# Patient Record
Sex: Male | Born: 1975 | Race: White | Hispanic: No | Marital: Married | Smoking: Never smoker
Health system: Southern US, Community
[De-identification: ages and names within clinical notes are randomized; demographics above are authoritative.]

## PROBLEM LIST (undated history)

## (undated) HISTORY — PX: NASAL SEPTUM SURGERY: SHX37

---

## 2007-09-06 ENCOUNTER — Emergency Department (HOSPITAL_COMMUNITY): Admission: EM | Admit: 2007-09-06 | Discharge: 2007-09-06 | Payer: Self-pay | Admitting: Emergency Medicine

## 2009-07-27 IMAGING — CT CT HEAD W/O CM
1 series · 16 of 30 positions shown, 20 images · non-contrast
Comparison: No prior studies.

CLINICAL DATA: Seizure activity today.

CT HEAD WITHOUT CONTRAST
TECHNIQUE: Contiguous axial images were obtained from the base of
the skull through the vertex without contrast.

[Series 2: head routine 4.8 h37s · axial · 0.43mm/px · z∈[-110,+18]mm · 16 of 30 slices shown, 20 images]
[im 2/30  brain]
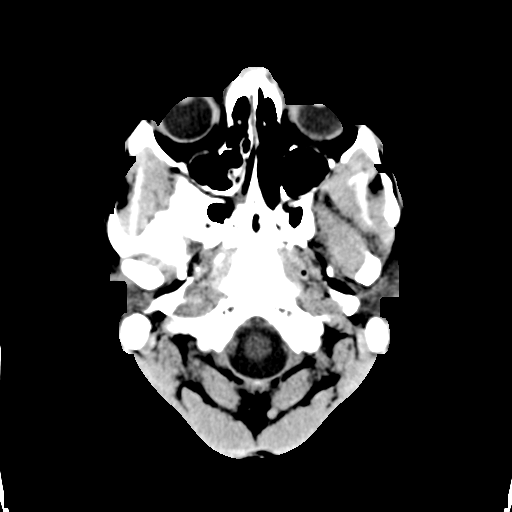
[im 2/30  bone]
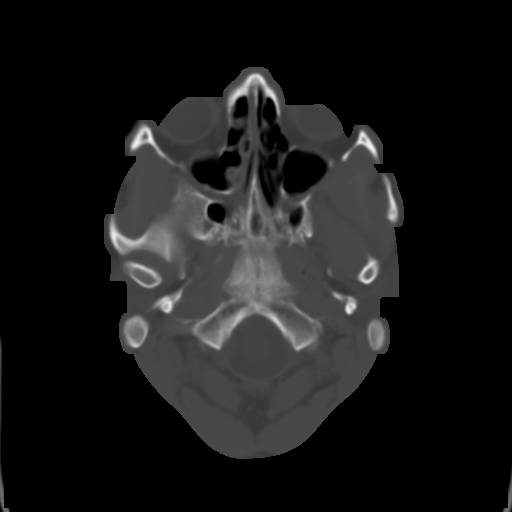
[im 4/30  brain]
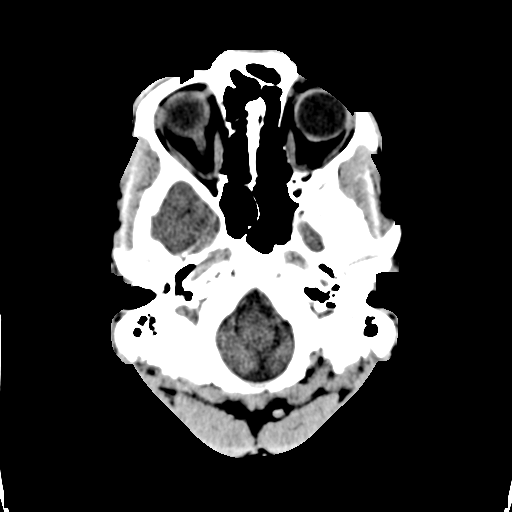
[im 6/30  brain]
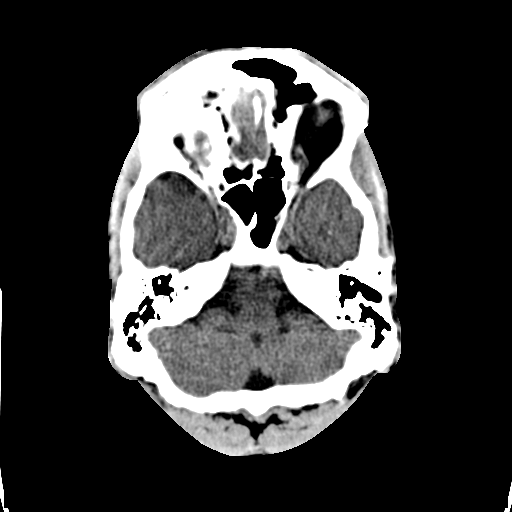
[im 8/30  brain]
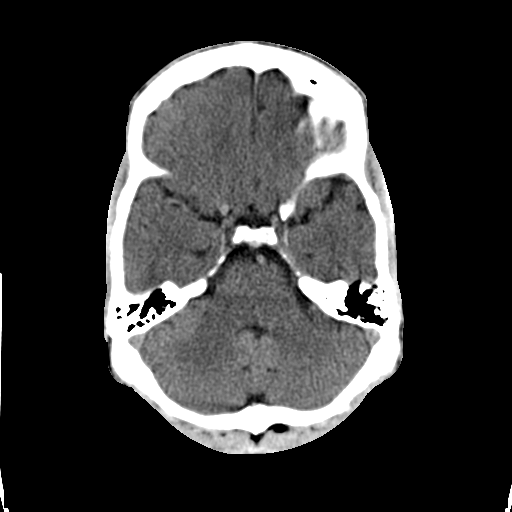
[im 9/30  brain]
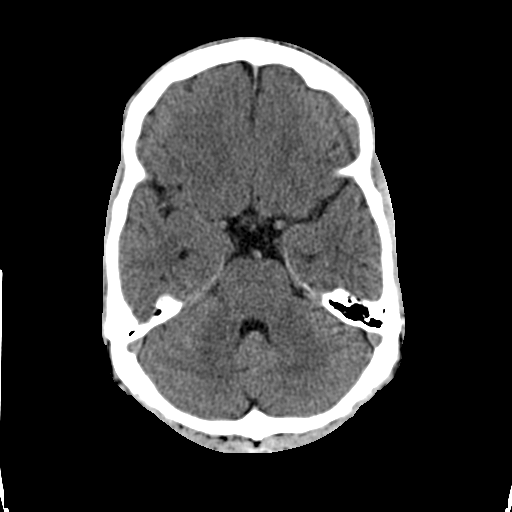
[im 9/30  bone]
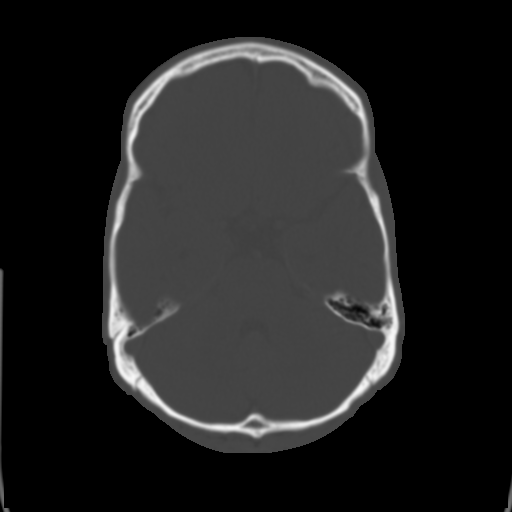
[im 11/30  brain]
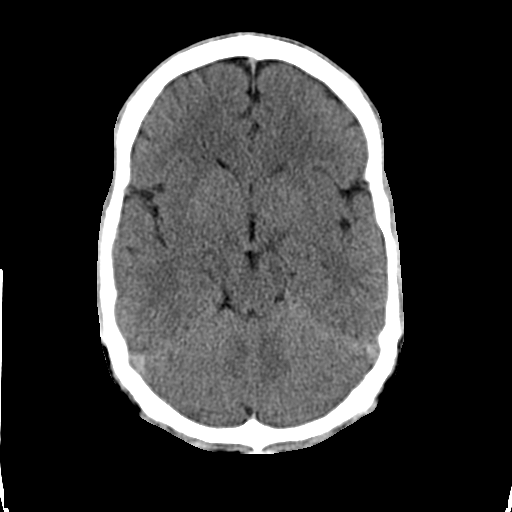
[im 13/30  brain]
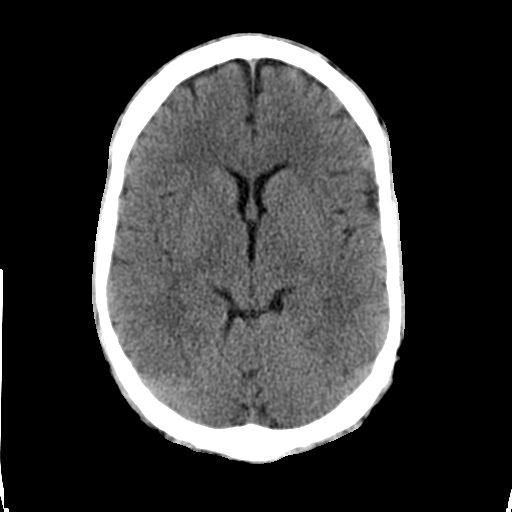
[im 15/30  brain]
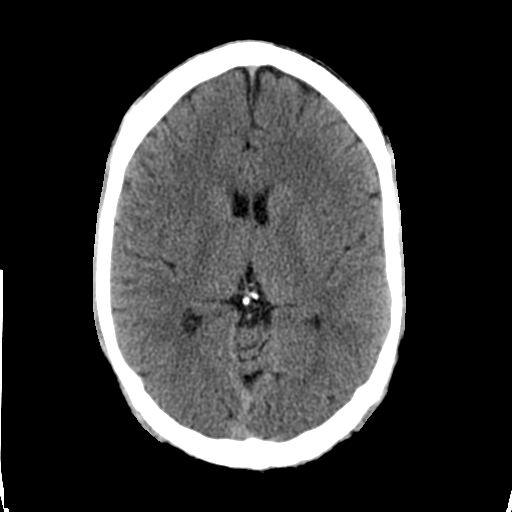
[im 16/30  brain]
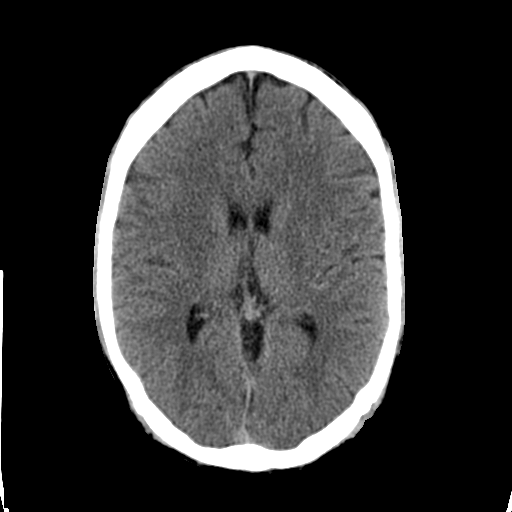
[im 16/30  bone]
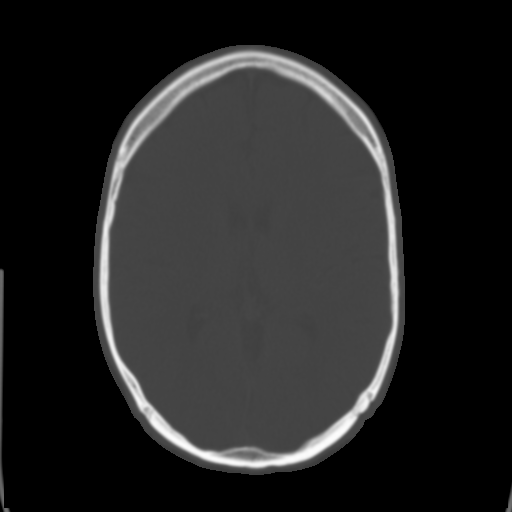
[im 18/30  brain]
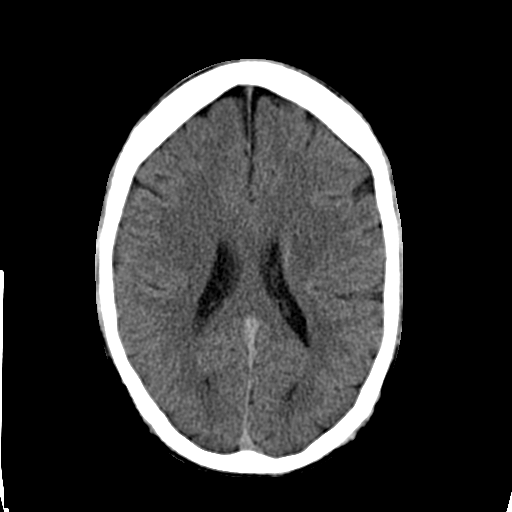
[im 20/30  brain]
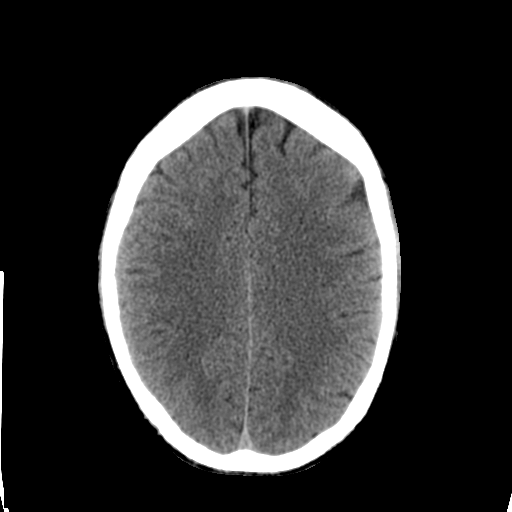
[im 22/30  brain]
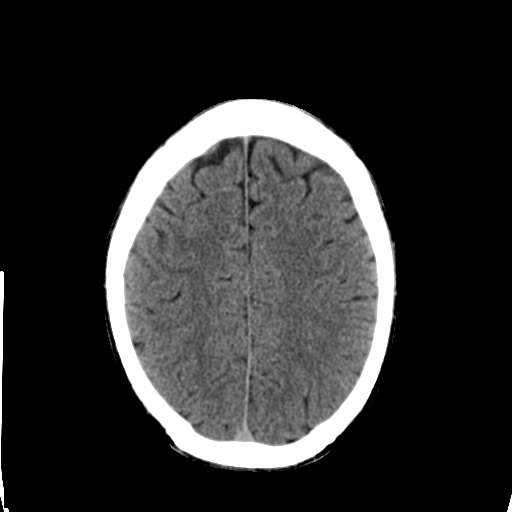
[im 23/30  brain]
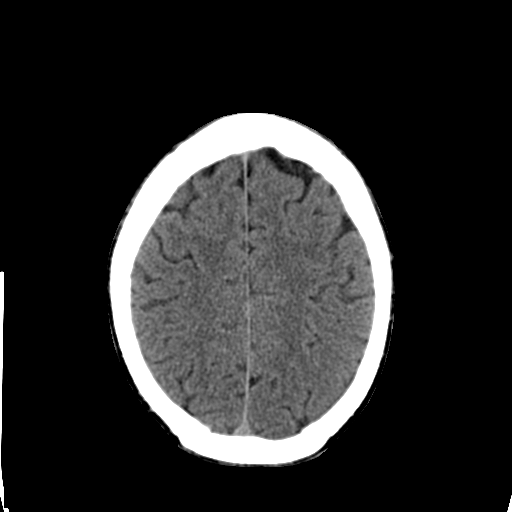
[im 23/30  bone]
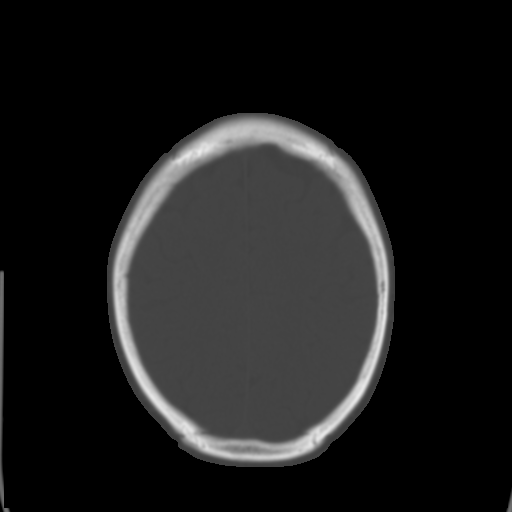
[im 25/30  brain]
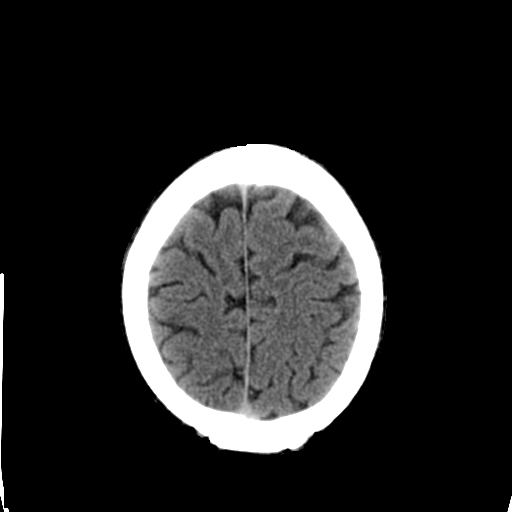
[im 27/30  brain]
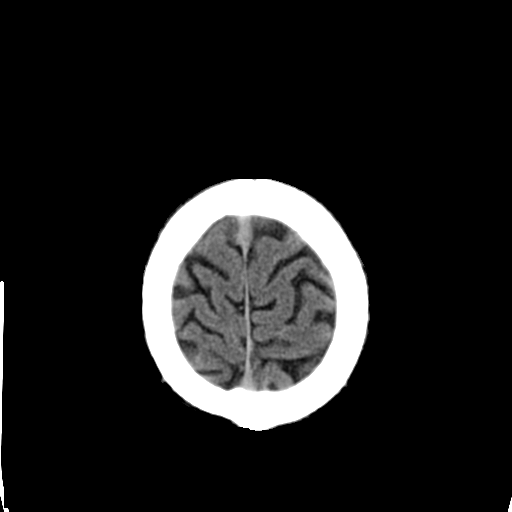
[im 29/30  brain]
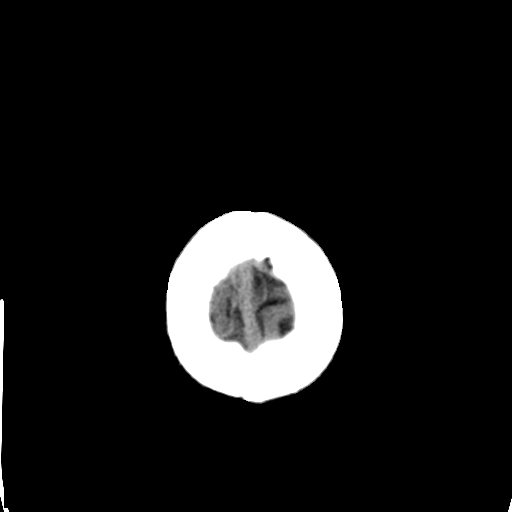

[16 of 30 positions shown; findings below may reference images not displayed]

FINDINGS: There are no midline shifts or mass effects.  The
ventricles are normal in size and contour.  No CT scan evidence for
recent stroke or hemorrhage and no extraaxial fluid collections.
IMPRESSION: Negative study..

## 2011-11-07 ENCOUNTER — Ambulatory Visit: Payer: BC Managed Care – PPO

## 2011-11-08 ENCOUNTER — Ambulatory Visit: Payer: BC Managed Care – PPO

## 2011-11-08 ENCOUNTER — Ambulatory Visit (INDEPENDENT_AMBULATORY_CARE_PROVIDER_SITE_OTHER): Payer: BC Managed Care – PPO | Admitting: Emergency Medicine

## 2011-11-08 VITALS — BP 104/66 | HR 68 | Temp 98.3°F | Resp 16 | Ht 64.0 in | Wt 161.0 lb

## 2011-11-08 DIAGNOSIS — M79609 Pain in unspecified limb: Secondary | ICD-10-CM

## 2011-11-08 DIAGNOSIS — S62309A Unspecified fracture of unspecified metacarpal bone, initial encounter for closed fracture: Secondary | ICD-10-CM

## 2011-11-08 DIAGNOSIS — M79643 Pain in unspecified hand: Secondary | ICD-10-CM

## 2011-11-08 NOTE — Progress Notes (Signed)
  Subjective:    Patient ID: Austin Schultz, male    DOB: [birth year], 36 y.o.   MRN: [id number]  HPI 36 year old male presents with left hand pain s/p a fall while playing football last night.  States he believes he fell landing on the posterior aspect of his hand and his 5th phalange bent backward.  Admits to some numbness but no tingling.  Has been taking Motrin for pain and says it has been well controlled.  Able to move all 5 fingers and wrist.  He is an otherwise healthy male with no known medical problems.     Review of Systems  Constitutional: Negative for fever and chills.  Musculoskeletal: Positive for joint swelling (and pain ).  Skin: Negative for wound.  Neurological: Negative for weakness.  All other systems reviewed and are negative.       Objective:   Physical Exam  Constitutional: He is oriented to person, place, and time. He appears well-developed and well-nourished.  HENT:  Head: Normocephalic and atraumatic.  Right Ear: External ear normal.  Left Ear: External ear normal.  Eyes: Conjunctivae normal are normal.  Neck: Normal range of motion.  Cardiovascular: Normal rate.   Pulmonary/Chest: Effort normal.  Musculoskeletal:       Left wrist: He exhibits normal range of motion, no tenderness and no bony tenderness.       Wrist normal. There is marked swelling over 3rd, 4th, and 5th metacarpals with pain to palpation between 4th and 5th metacarpals. Pain with ROM of 5th phalange. Ecymmosis of dorsum of hand. Normal cap refill and sensation  Neurological: He is alert and oriented to person, place, and time.  Psychiatric: He has a normal mood and affect. His behavior is normal. Judgment and thought content normal.      [facility] reading (PRIMARY) by  Dr. Cleta Alberts as spiral fracture or 5th metacarpal with shortening.      Assessment & Plan:   1. Fracture of metacarpal  Ambulatory referral to Orthopedic Surgery  2. Hand pain  DG Hand Complete Left  Placed in  ulnar-gutter splint and sling for comfort. Patient declines pain medication at this time, but ok to call in Vicodin if needed. He will take OTC ibuprofen or tylenol prn Plan to see Dr. Melvyn Novas or Amanda Pea this week - referral made.   Follow up as needed.

## 2012-03-30 ENCOUNTER — Ambulatory Visit (INDEPENDENT_AMBULATORY_CARE_PROVIDER_SITE_OTHER): Payer: 59 | Admitting: Family Medicine

## 2012-03-30 VITALS — BP 122/80 | HR 72 | Temp 97.8°F | Resp 16 | Ht 64.0 in | Wt 163.0 lb

## 2012-03-30 DIAGNOSIS — B356 Tinea cruris: Secondary | ICD-10-CM

## 2012-03-30 DIAGNOSIS — R1032 Left lower quadrant pain: Secondary | ICD-10-CM

## 2012-03-30 DIAGNOSIS — M3390 Dermatopolymyositis, unspecified, organ involvement unspecified: Secondary | ICD-10-CM

## 2012-03-30 LAB — POCT CBC
HCT, POC: 43.3 % — AB (ref 43.5–53.7)
Hemoglobin: 14 g/dL — AB (ref 14.1–18.1)
Lymph, poc: 2.3 (ref 0.6–3.4)
MCH, POC: 29.7 pg (ref 27–31.2)
MCV: 91.8 fL (ref 80–97)
MPV: 11.4 fL (ref 0–99.8)
POC MID %: 8.8 %M (ref 0–12)
RBC: 4.72 M/uL (ref 4.69–6.13)

## 2012-03-30 LAB — POCT URINALYSIS DIPSTICK
Bilirubin, UA: NEGATIVE
Glucose, UA: NEGATIVE
Ketones, UA: NEGATIVE
Leukocytes, UA: NEGATIVE

## 2012-03-30 LAB — POCT UA - MICROSCOPIC ONLY
Bacteria, U Microscopic: NEGATIVE
Mucus, UA: NEGATIVE
RBC, urine, microscopic: NEGATIVE
WBC, Ur, HPF, POC: NEGATIVE

## 2012-03-30 MED ORDER — TERBINAFINE HCL 250 MG PO TABS: 250.0000 mg | ORAL_TABLET | Freq: Every day | ORAL | Status: DC

## 2012-03-30 NOTE — Patient Instructions (Signed)
Continue to drink lots of fluids to flush her kidneys out well  Avoid heavy lifting or straining  If pains continue to persist we will consider getting a scan or referring you to a surgeon for their opinion as to whether you might be getting an early hernia.  Take the terbinafine one pill daily for 2 weeks for the jock itch

## 2012-03-30 NOTE — Progress Notes (Signed)
Subjective: Patient been having a jock itch he's tried OTC medications on it. It seems to be better controlled a little bit that comes back.  He also been having left lower quadrant abdominal pain for about the last 10 days or so. He is gradually gotten worse especially last couple of days. It's a pain down in the left groin is fairly continuous but comes and goes to some degree. He wonders whether he should be doing hernia. Knows of no lifting or straining that could have injured it. Has not had any surgeries on his abdomen.  He is getting married Saturday and going on honeymoon this weekend to [place]  Objective: No acute distress. Normal bowel sounds. Soft without mass or tenderness. Having normal bowel movements. Normal male external genitalia. Testes descended. No hernias no little tenderness in the left groin region. Mild rash increase her groin  Assessment: Tinea cruris Left lower quadrant abdominal pain  Plan: Check CBC and urinalysis Results for orders placed in visit on 03/30/12  POCT URINALYSIS DIPSTICK      Result Value Range   Color, UA light yellow     Clarity, UA clear     Glucose, UA neg     Bilirubin, UA neg     Ketones, UA neg     Spec Grav, UA <=1.005     Blood, UA neg     pH, UA 6.5     Protein, UA neg     Urobilinogen, UA 0.2     Nitrite, UA neg     Leukocytes, UA Negative    POCT UA - MICROSCOPIC ONLY      Result Value Range   WBC, Ur, HPF, POC neg     RBC, urine, microscopic neg     Bacteria, U Microscopic neg     Mucus, UA neg     Epithelial cells, urine per micros 0-1     Crystals, Ur, HPF, POC neg     Casts, Ur, LPF, POC neg     Yeast, UA neg    POCT CBC      Result Value Range   WBC 7.6  4.6 - 10.2 K/uL   Lymph, poc 2.3  0.6 - 3.4   POC LYMPH PERCENT 30.2  10 - 50 %L   MID (cbc) 0.7  0 - 0.9   POC MID % 8.8  0 - 12 %M   POC Granulocyte 4.6  2 - 6.9   Granulocyte percent 61.0  37 - 80 %G   RBC 4.72  4.69 - 6.13 M/uL   Hemoglobin 14.0 (*)  14.1 - 18.1 g/dL   HCT, POC 16.1 (*) 09.6 - 53.7 %   MCV 91.8  80 - 97 fL   MCH, POC 29.7  27 - 31.2 pg   MCHC 32.3  31.8 - 35.4 g/dL   RDW, POC 04.5     Platelet Count, POC 219  142 - 424 K/uL   MPV 11.4  0 - 99.8 fL   Assessment: Groin strain with pain, suspicious for developing an early hernia Tinea cruris  Plan: See orders for discharge instructions

## 2012-08-08 ENCOUNTER — Other Ambulatory Visit: Payer: Self-pay | Admitting: Family Medicine

## 2012-08-08 NOTE — Telephone Encounter (Signed)
PATIENT IS CALLING TO SAY IT IS URGENT THAT HE GETS HIS MEDS FOR [facility]

## 2013-09-28 IMAGING — CR DG HAND COMPLETE 3+V*L*
3 series · 3 of 3 positions shown · non-contrast
Comparison: None.

CLINICAL DATA: Recent traumatic injury with pain

LEFT HAND - COMPLETE 3+ VIEW

[PA]
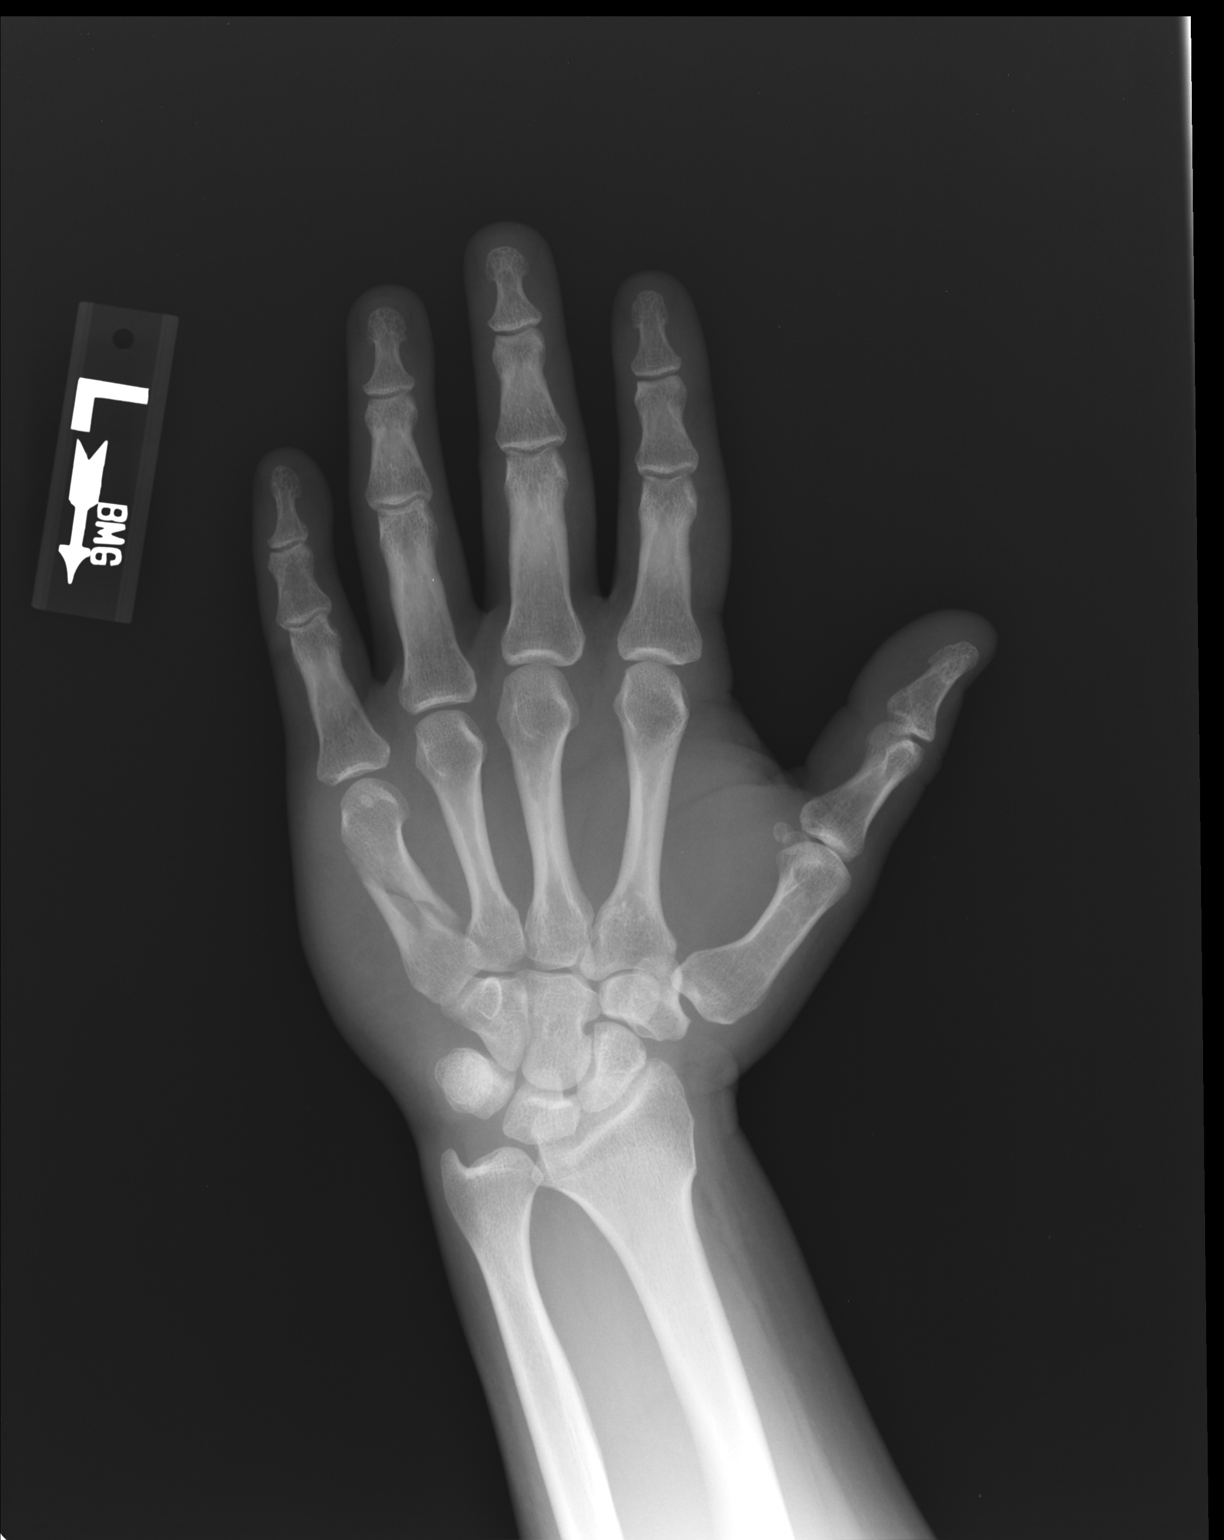

[pa obl]
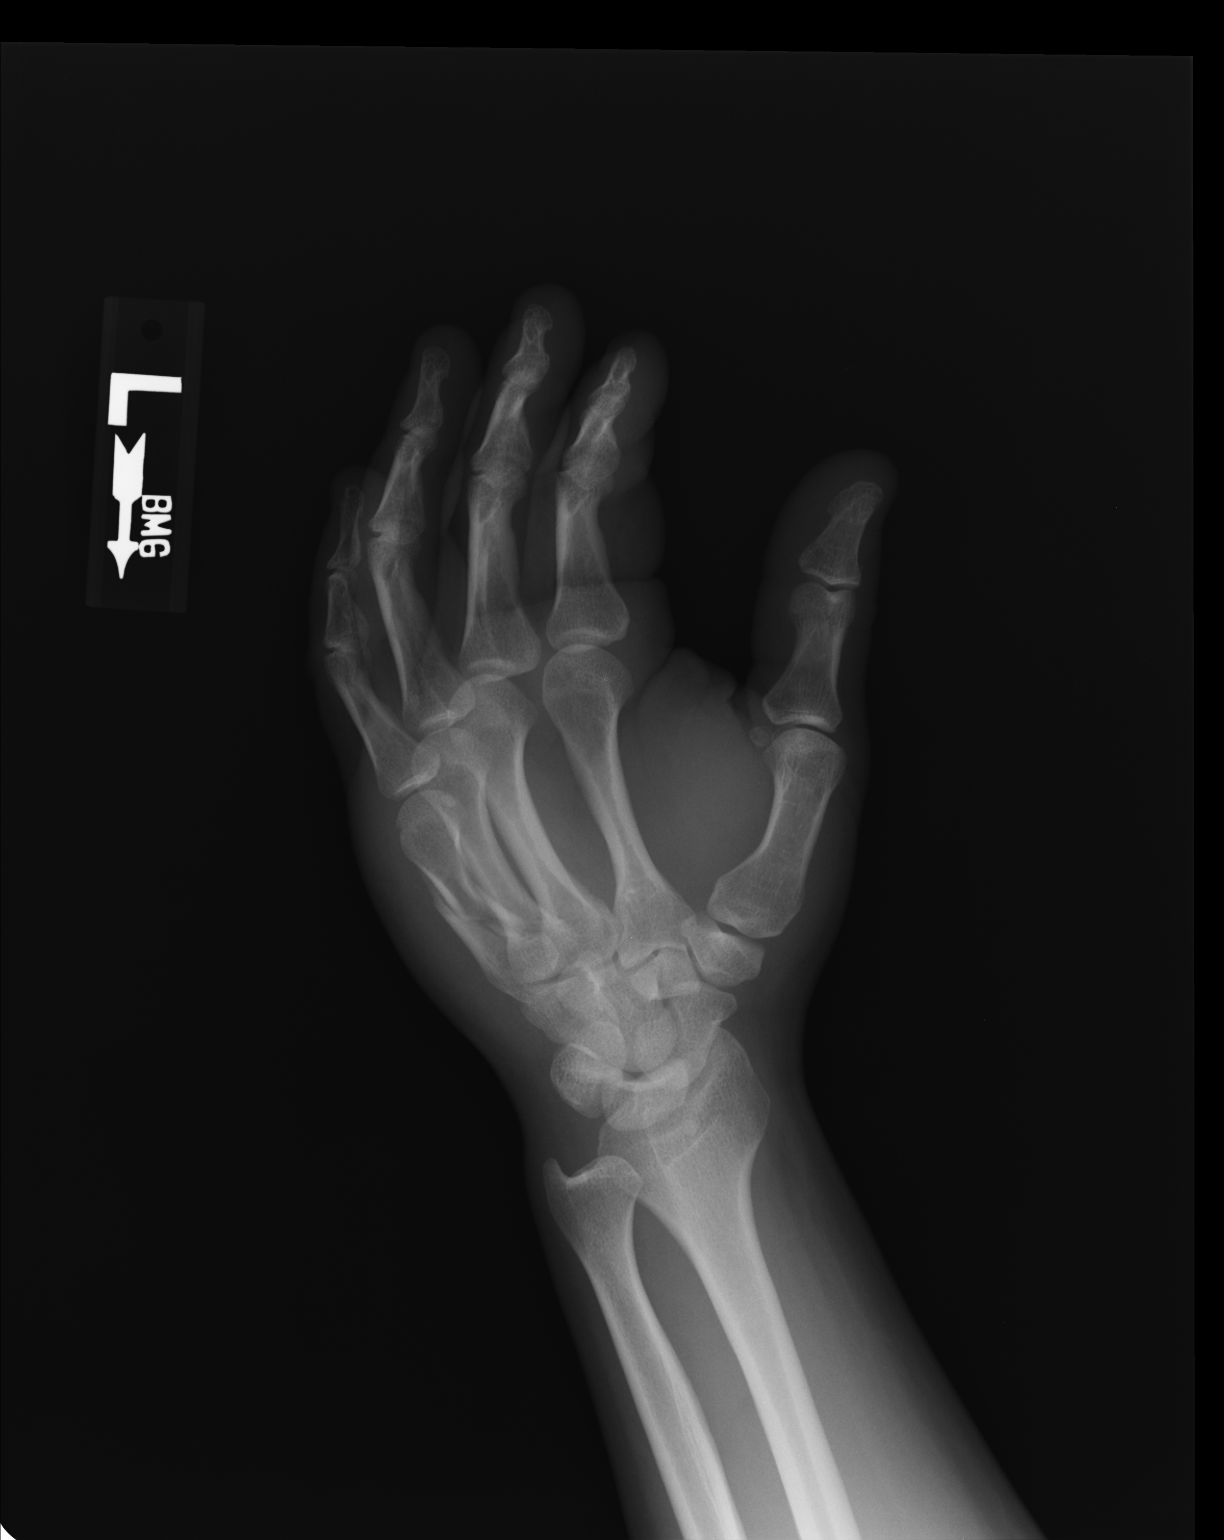

[lateral]
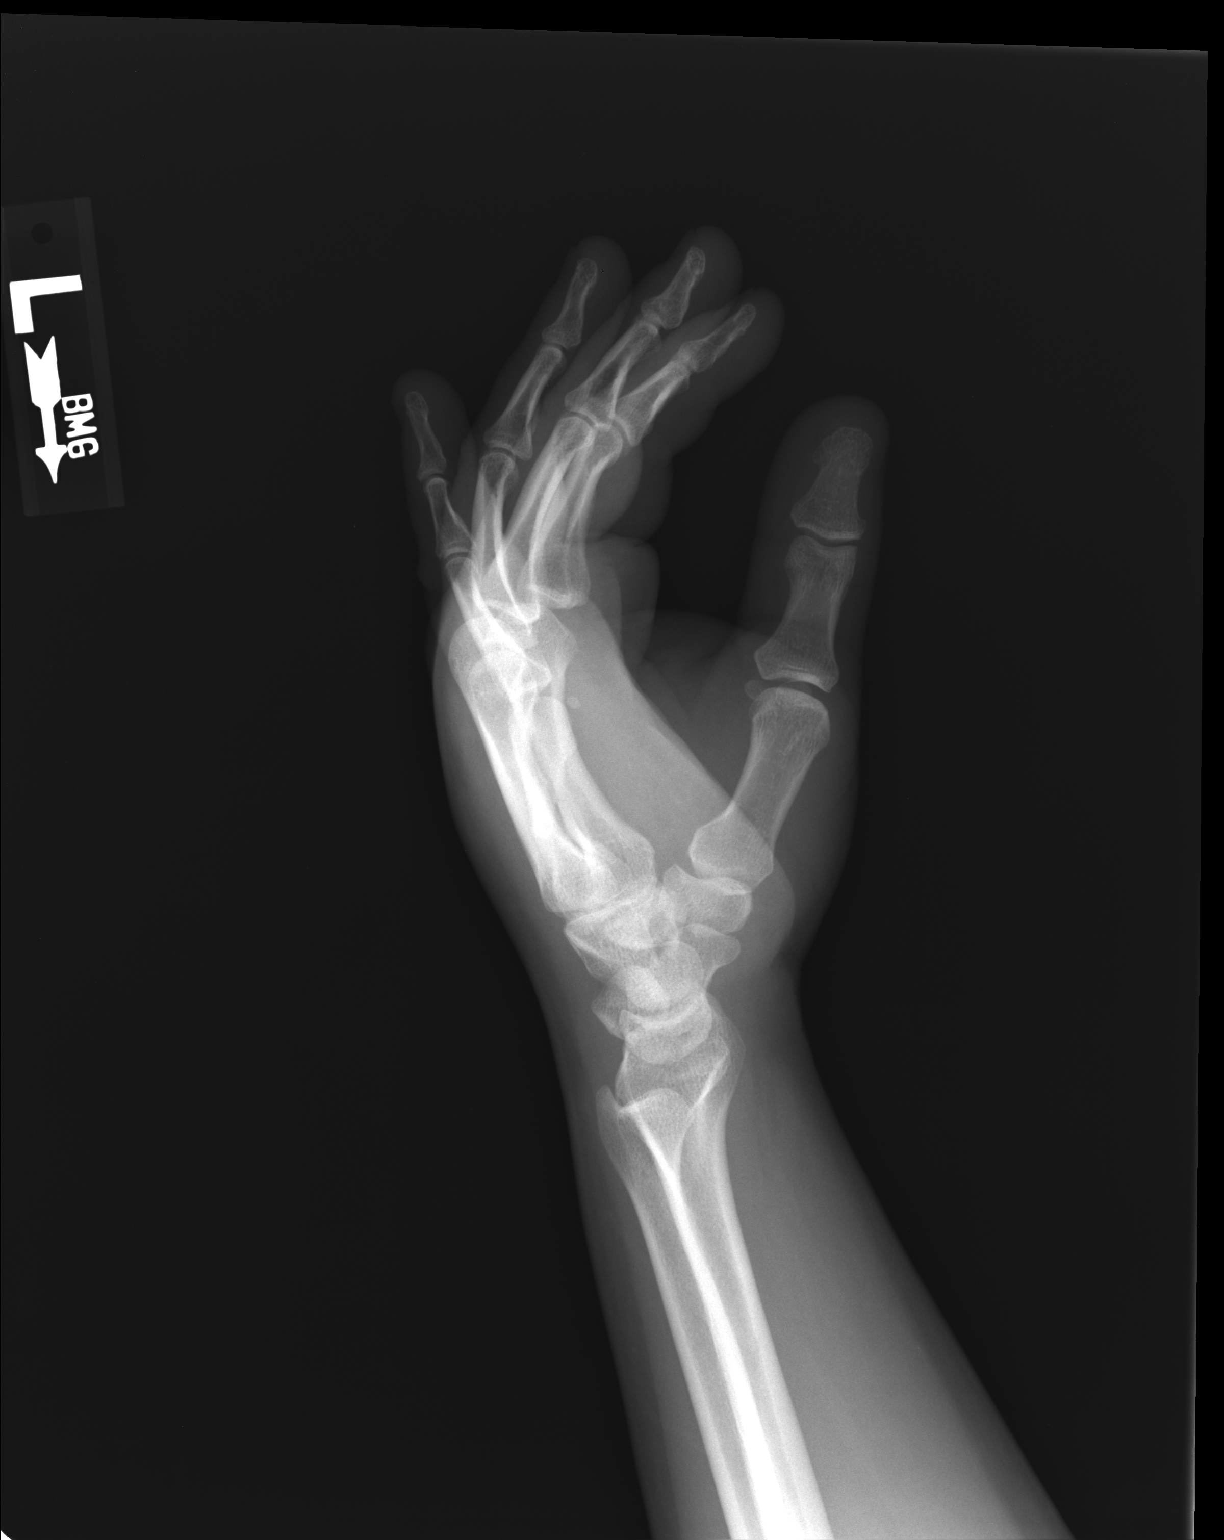

[3 of 3 positions shown; findings below may reference images not displayed]

FINDINGS: Three views of the left hand were obtained and reveal an
oblique fracture through the mid shaft of the fifth metacarpal with
some impaction at the fracture site.  No other fracture is noted.
Considerable soft tissue swelling is noted in the region fracture.
IMPRESSION: Oblique fracture through the mid region of the fifth metacarpal
with some impaction.

## 2019-09-27 ENCOUNTER — Encounter (HOSPITAL_BASED_OUTPATIENT_CLINIC_OR_DEPARTMENT_OTHER): Payer: Self-pay | Admitting: *Deleted

## 2019-09-27 ENCOUNTER — Emergency Department (HOSPITAL_BASED_OUTPATIENT_CLINIC_OR_DEPARTMENT_OTHER)
Admission: EM | Admit: 2019-09-27 | Discharge: 2019-09-27 | Disposition: A | Discharge status: Home / Self Care | Payer: No Typology Code available for payment source | Attending: Emergency Medicine | Admitting: Emergency Medicine

## 2019-09-27 ENCOUNTER — Other Ambulatory Visit: Payer: Self-pay

## 2019-09-27 DIAGNOSIS — S0083XA Contusion of other part of head, initial encounter: Secondary | ICD-10-CM | POA: Diagnosis not present

## 2019-09-27 DIAGNOSIS — Z5321 Procedure and treatment not carried out due to patient leaving prior to being seen by health care provider: Secondary | ICD-10-CM | POA: Insufficient documentation

## 2019-09-27 DIAGNOSIS — R1110 Vomiting, unspecified: Secondary | ICD-10-CM | POA: Diagnosis not present

## 2019-09-27 DIAGNOSIS — R5383 Other fatigue: Secondary | ICD-10-CM | POA: Diagnosis not present

## 2019-09-27 DIAGNOSIS — S0990XA Unspecified injury of head, initial encounter: Secondary | ICD-10-CM | POA: Diagnosis present

## 2019-09-27 DIAGNOSIS — R0781 Pleurodynia: Secondary | ICD-10-CM | POA: Diagnosis not present

## 2019-09-27 DIAGNOSIS — M25511 Pain in right shoulder: Secondary | ICD-10-CM | POA: Insufficient documentation
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
## 2019-09-27 NOTE — ED Triage Notes (Signed)
Pt alert. Informed registration staff he was leaving. Pt walked out of ED independently

## 2023-12-05 ENCOUNTER — Telehealth: Payer: Self-pay | Admitting: *Deleted

## 2023-12-05 NOTE — Telephone Encounter (Signed)
 HIs dad sent a mychart about his son thru his mychart.  Will you take him on as new patient?  You see his dad Benjiman.

## 2023-12-05 NOTE — Telephone Encounter (Signed)
 Morene KIDD [place] [facility] to [employer] [facility] (supporting Eleanor Ponto, NP) (Selected Message)     12/05/23 11:03 AM He would be a new patient    Canter, Kaylyn, CMA to [facility] Townley     12/05/23  8:29 AM Good morning, Is Saajan a patient of Melissa's?    [facility]  Last read by Morene KIDD Janise Odis at 11:03AM on 12/05/2023. Morene KIDD [place] [facility] to [facility] (supporting Eleanor Ponto, NP)     12/04/23  4:28 PM Hi;  I need for you to examine him and make sure nothing physical is causing his depression.  He does not have health insurance so we will be paying for this and any sub-quint treatments.  After your examination could you then recommend the proper route for treatment ie: Physicist or Psychologist.  Jodie Odis
